# Patient Record
Sex: Female | Born: 1950 | Race: White | Hispanic: No | Marital: Single | Smoking: Current every day smoker
Health system: Southern US, Community
[De-identification: ages and names within clinical notes are randomized; demographics above are authoritative.]

## PROBLEM LIST (undated history)

## (undated) DIAGNOSIS — J449 Chronic obstructive pulmonary disease, unspecified: Secondary | ICD-10-CM

## (undated) HISTORY — DX: Chronic obstructive pulmonary disease, unspecified: J44.9

---

## 2006-01-31 ENCOUNTER — Emergency Department: Payer: Self-pay | Admitting: Emergency Medicine

## 2006-06-15 ENCOUNTER — Other Ambulatory Visit: Payer: Self-pay

## 2006-06-15 ENCOUNTER — Emergency Department: Payer: Self-pay | Admitting: Emergency Medicine

## 2007-03-17 ENCOUNTER — Other Ambulatory Visit: Payer: Self-pay

## 2007-03-17 ENCOUNTER — Emergency Department: Payer: Self-pay | Admitting: Emergency Medicine

## 2007-11-20 ENCOUNTER — Inpatient Hospital Stay: Payer: Self-pay | Admitting: Internal Medicine

## 2007-11-20 ENCOUNTER — Other Ambulatory Visit: Payer: Self-pay

## 2008-06-10 ENCOUNTER — Emergency Department: Payer: Self-pay | Admitting: Emergency Medicine

## 2008-09-11 IMAGING — CR DG CHEST 2V
1 series · 2 of 2 positions shown · non-contrast
Comparison: none

REASON FOR EXAM: SOB, COUGH; [HOSPITAL]
COMMENTS:

[Series 1: view not recorded · 0.17mm/px · 2 of 2 slices shown]
[im 1/2]
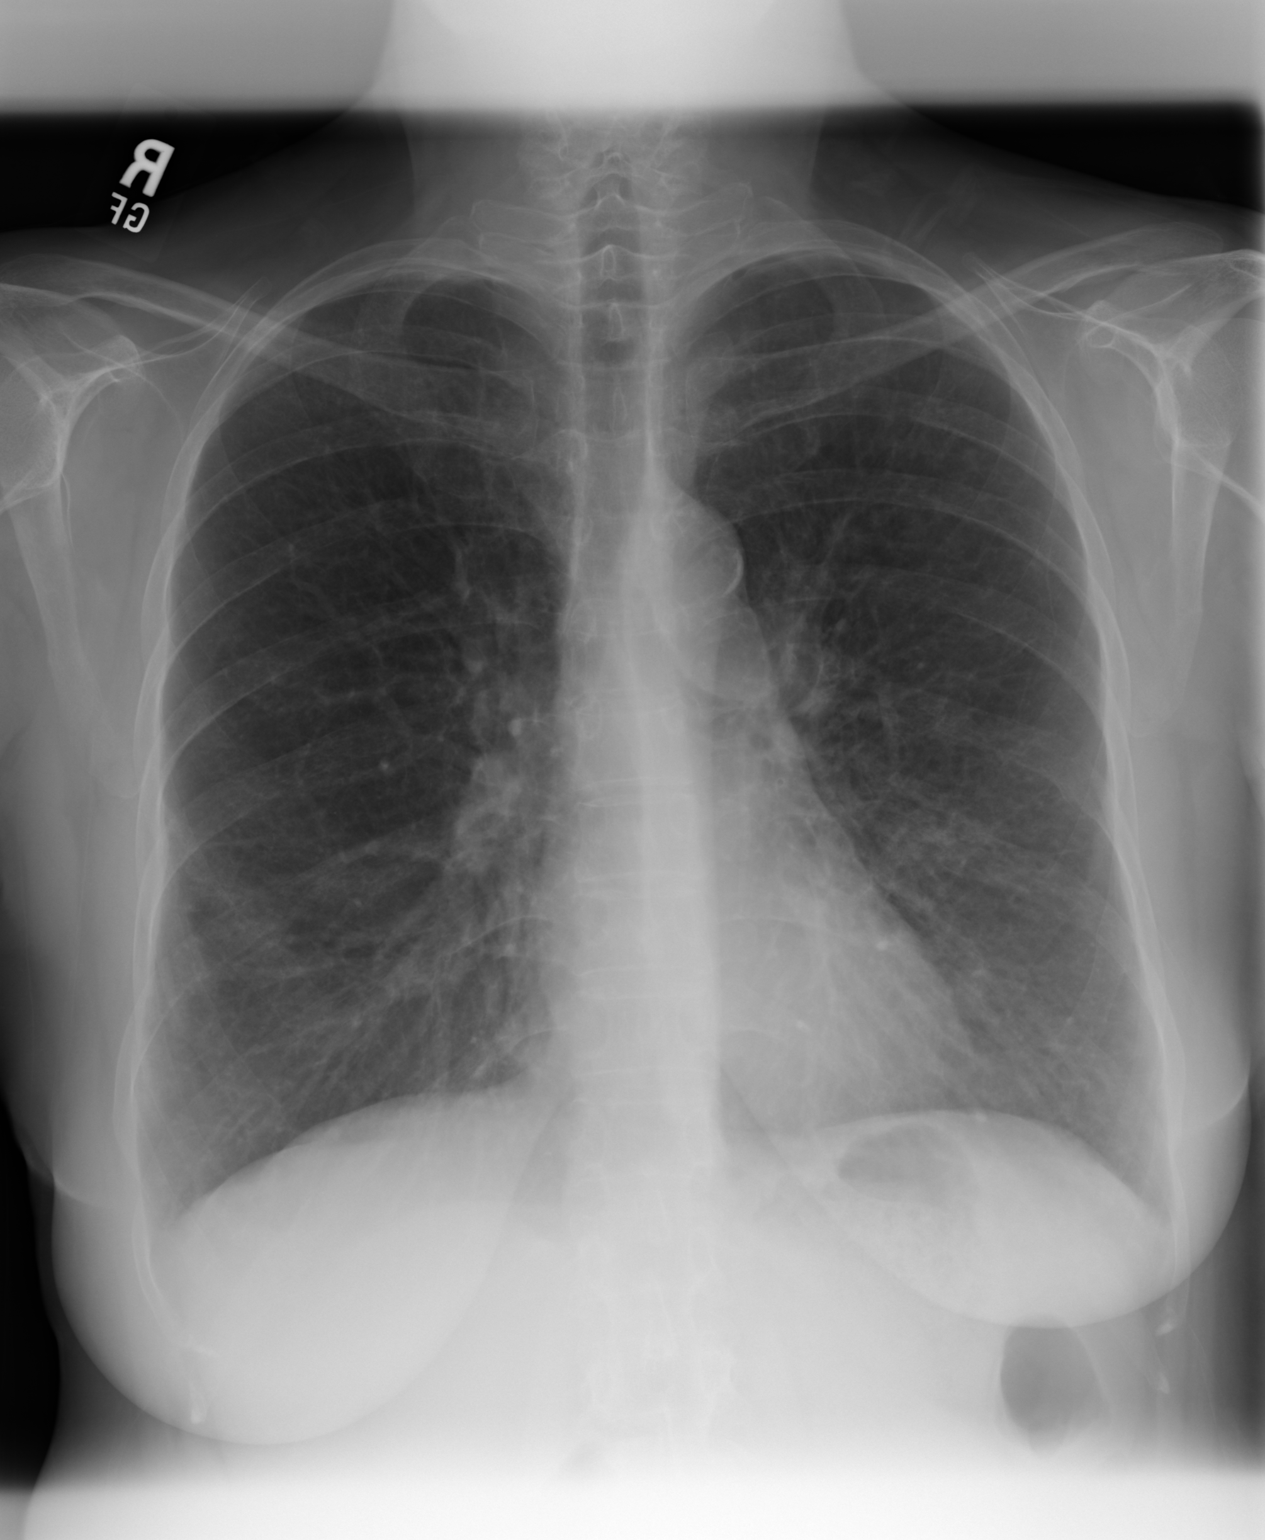
[im 2/2]
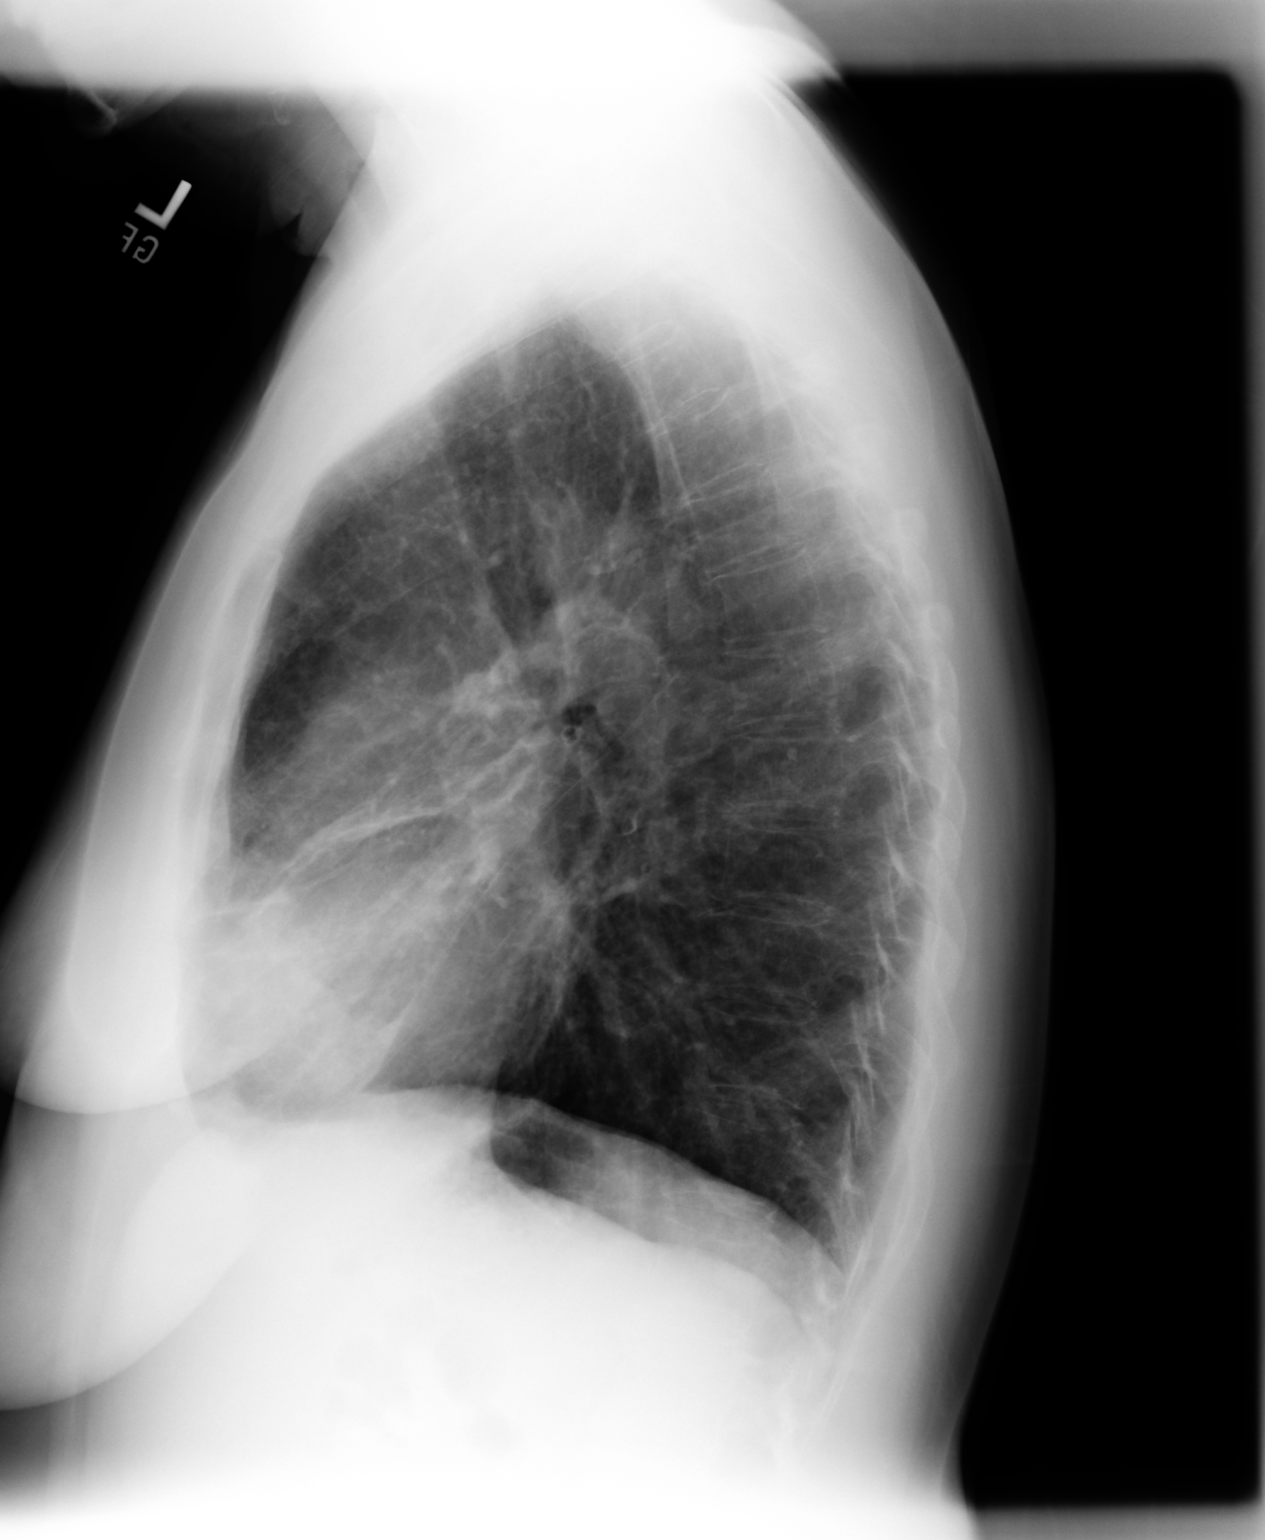

[2 of 2 positions shown; findings below may reference images not displayed]

PROCEDURE:     DXR - DXR CHEST PA (OR AP) AND LATERAL  - June 15, 2006  [DATE]

RESULT:     There is thickening of the RIGHT middle lobe lung markings
compatible with pneumonia or atelectasis. The lung fields otherwise are
clear. Heart size is normal. The chest is mildly hyperexpanded consistent
with a history of COPD or asthma. Incidental note is made of a fleck of
calcium in the aortic knob. No acute bony abnormalities are seen.
IMPRESSION: 1)There is increased density in the RIGHT middle lobe consistent with
pneumonia or atelectasis.

2)The chest appears mildly hyperexpanded.

## 2008-09-25 ENCOUNTER — Inpatient Hospital Stay: Payer: Self-pay | Admitting: Internal Medicine

## 2009-08-08 ENCOUNTER — Inpatient Hospital Stay: Payer: Self-pay | Admitting: Internal Medicine

## 2011-09-04 ENCOUNTER — Inpatient Hospital Stay: Payer: Self-pay | Admitting: Internal Medicine

## 2011-09-04 LAB — COMPREHENSIVE METABOLIC PANEL
Anion Gap: 6 — ABNORMAL LOW (ref 7–16)
BUN: 10 mg/dL (ref 7–18)
Bilirubin,Total: 0.3 mg/dL (ref 0.2–1.0)
Chloride: 105 mmol/L (ref 98–107)
Creatinine: 0.64 mg/dL (ref 0.60–1.30)
EGFR (African American): >60
Glucose: 129 mg/dL — ABNORMAL HIGH (ref 65–99)
Potassium: 3.9 mmol/L (ref 3.5–5.1)
SGOT(AST): 14 U/L — ABNORMAL LOW (ref 15–37)
SGPT (ALT): 21 U/L
Total Protein: 7.7 g/dL (ref 6.4–8.2)

## 2011-09-04 LAB — URINALYSIS, COMPLETE
Bacteria: NONE SEEN
Glucose,UR: 50 mg/dL (ref 0–75)
Ketone: NEGATIVE
Ph: 6 (ref 4.5–8.0)
Specific Gravity: 1.004 (ref 1.003–1.030)
Squamous Epithelial: <1
WBC UR: NONE SEEN /HPF (ref 0–5)

## 2011-09-04 LAB — CBC WITH DIFFERENTIAL/PLATELET
Basophil #: 0.1 10*3/uL (ref 0.0–0.1)
Eosinophil #: 0.6 10*3/uL (ref 0.0–0.7)
HGB: 15.7 g/dL (ref 12.0–16.0)
Lymphocyte %: 29.1 %
MCHC: 33.3 g/dL (ref 32.0–36.0)
Monocyte #: 0.8 10*3/uL — ABNORMAL HIGH (ref 0.0–0.7)
Neutrophil %: 56.1 %
RDW: 14 % (ref 11.5–14.5)
WBC: 10.1 10*3/uL (ref 3.6–11.0)

## 2011-09-04 LAB — HEMOGLOBIN A1C: Hemoglobin A1C: 6.5 % — ABNORMAL HIGH (ref 4.2–6.3)

## 2011-09-04 LAB — PROTIME-INR
INR: 0.9
Prothrombin Time: 12 secs (ref 11.5–14.7)

## 2011-09-05 LAB — CBC WITH DIFFERENTIAL/PLATELET
Basophil %: 0 %
Eosinophil %: 0.1 %
HGB: 15.2 g/dL (ref 12.0–16.0)
Lymphocyte #: 0.7 10*3/uL — ABNORMAL LOW (ref 1.0–3.6)
Lymphocyte %: 5.3 %
MCH: 31.3 pg (ref 26.0–34.0)
MCV: 95 fL (ref 80–100)
Monocyte #: 0.3 10*3/uL (ref 0.0–0.7)
Monocyte %: 2.1 %
Platelet: 323 10*3/uL (ref 150–440)

## 2011-09-05 LAB — BASIC METABOLIC PANEL
Anion Gap: 8 (ref 7–16)
BUN: 14 mg/dL (ref 7–18)
Calcium, Total: 9.3 mg/dL (ref 8.5–10.1)
Creatinine: 0.79 mg/dL (ref 0.60–1.30)
EGFR (African American): >60
EGFR (Non-African Amer.): >60
Sodium: 139 mmol/L (ref 136–145)

## 2012-01-21 ENCOUNTER — Inpatient Hospital Stay: Payer: Self-pay | Admitting: Internal Medicine

## 2012-01-21 LAB — CBC
HCT: 47.7 % — ABNORMAL HIGH (ref 35.0–47.0)
HGB: 15.6 g/dL (ref 12.0–16.0)
MCH: 30.8 pg (ref 26.0–34.0)
MCHC: 32.7 g/dL (ref 32.0–36.0)
RBC: 5.05 10*6/uL (ref 3.80–5.20)
RDW: 13.8 % (ref 11.5–14.5)

## 2012-01-21 LAB — BASIC METABOLIC PANEL
BUN: 5 mg/dL — ABNORMAL LOW (ref 7–18)
Chloride: 102 mmol/L (ref 98–107)
Co2: 24 mmol/L (ref 21–32)
Creatinine: 0.71 mg/dL (ref 0.60–1.30)
EGFR (African American): >60
Glucose: 140 mg/dL — ABNORMAL HIGH (ref 65–99)
Potassium: 3.8 mmol/L (ref 3.5–5.1)
Sodium: 139 mmol/L (ref 136–145)

## 2012-01-22 LAB — CBC WITH DIFFERENTIAL/PLATELET
Basophil %: 0.1 %
Eosinophil #: 0 10*3/uL (ref 0.0–0.7)
Eosinophil %: 0 %
HCT: 45.6 % (ref 35.0–47.0)
HGB: 14.9 g/dL (ref 12.0–16.0)
Lymphocyte #: 0.4 10*3/uL — ABNORMAL LOW (ref 1.0–3.6)
Lymphocyte %: 5.9 %
MCH: 30.8 pg (ref 26.0–34.0)
MCHC: 32.7 g/dL (ref 32.0–36.0)
MCV: 94 fL (ref 80–100)
Monocyte #: 0.1 x10 3/mm — ABNORMAL LOW (ref 0.2–0.9)
Monocyte %: 1.1 %
Neutrophil #: 6.8 10*3/uL — ABNORMAL HIGH (ref 1.4–6.5)
Platelet: 288 10*3/uL (ref 150–440)
RBC: 4.84 10*6/uL (ref 3.80–5.20)
WBC: 7.3 10*3/uL (ref 3.6–11.0)

## 2012-01-22 LAB — BASIC METABOLIC PANEL
Calcium, Total: 9 mg/dL (ref 8.5–10.1)
Chloride: 100 mmol/L (ref 98–107)
Creatinine: 0.63 mg/dL (ref 0.60–1.30)
EGFR (African American): >60
EGFR (Non-African Amer.): >60
Glucose: 223 mg/dL — ABNORMAL HIGH (ref 65–99)
Osmolality: 276 (ref 275–301)

## 2012-01-23 LAB — BASIC METABOLIC PANEL
Anion Gap: 4 — ABNORMAL LOW (ref 7–16)
BUN: 10 mg/dL (ref 7–18)
Calcium, Total: 8.9 mg/dL (ref 8.5–10.1)
Chloride: 102 mmol/L (ref 98–107)
Co2: 31 mmol/L (ref 21–32)
EGFR (Non-African Amer.): >60
Osmolality: 279 (ref 275–301)
Sodium: 137 mmol/L (ref 136–145)

## 2012-01-23 LAB — CBC WITH DIFFERENTIAL/PLATELET
Basophil %: 0.1 %
Eosinophil #: 0 10*3/uL (ref 0.0–0.7)
Eosinophil %: 0 %
Lymphocyte #: 0.7 10*3/uL — ABNORMAL LOW (ref 1.0–3.6)
Lymphocyte %: 4.2 %
MCH: 29.8 pg (ref 26.0–34.0)
MCHC: 31.9 g/dL — ABNORMAL LOW (ref 32.0–36.0)
MCV: 94 fL (ref 80–100)
Monocyte #: 0.5 x10 3/mm (ref 0.2–0.9)
Neutrophil %: 92.7 %
Platelet: 285 10*3/uL (ref 150–440)
RBC: 4.56 10*6/uL (ref 3.80–5.20)

## 2012-01-24 LAB — HEMOGLOBIN A1C: Hemoglobin A1C: 6 % (ref 4.2–6.3)

## 2012-09-04 ENCOUNTER — Emergency Department: Payer: Self-pay | Admitting: Emergency Medicine

## 2012-09-04 LAB — COMPREHENSIVE METABOLIC PANEL
Alkaline Phosphatase: 74 U/L (ref 50–136)
Bilirubin,Total: 0.3 mg/dL (ref 0.2–1.0)
Calcium, Total: 9.1 mg/dL (ref 8.5–10.1)
Co2: 27 mmol/L (ref 21–32)
Creatinine: 0.47 mg/dL — ABNORMAL LOW (ref 0.60–1.30)
EGFR (African American): >60
EGFR (Non-African Amer.): >60
Osmolality: 278 (ref 275–301)
SGPT (ALT): 19 U/L (ref 12–78)
Sodium: 140 mmol/L (ref 136–145)
Total Protein: 7.7 g/dL (ref 6.4–8.2)

## 2012-09-04 LAB — CBC
HGB: 16.3 g/dL — ABNORMAL HIGH (ref 12.0–16.0)
MCH: 30.8 pg (ref 26.0–34.0)
RDW: 14.2 % (ref 11.5–14.5)

## 2012-09-04 LAB — CK TOTAL AND CKMB (NOT AT ARMC)
CK, Total: 109 U/L (ref 21–215)
CK-MB: 2.2 ng/mL (ref 0.5–3.6)

## 2012-09-04 LAB — PRO B NATRIURETIC PEPTIDE: B-Type Natriuretic Peptide: 123 pg/mL (ref 0–125)

## 2012-09-17 ENCOUNTER — Inpatient Hospital Stay: Payer: Self-pay | Admitting: Internal Medicine

## 2012-09-17 LAB — COMPREHENSIVE METABOLIC PANEL
Alkaline Phosphatase: 89 U/L (ref 50–136)
BUN: 11 mg/dL (ref 7–18)
Calcium, Total: 9 mg/dL (ref 8.5–10.1)
Co2: 28 mmol/L (ref 21–32)
EGFR (African American): >60
EGFR (Non-African Amer.): >60
Glucose: 133 mg/dL — ABNORMAL HIGH (ref 65–99)
Osmolality: 273 (ref 275–301)
Potassium: 4.4 mmol/L (ref 3.5–5.1)
SGOT(AST): 35 U/L (ref 15–37)
SGPT (ALT): 22 U/L (ref 12–78)
Sodium: 136 mmol/L (ref 136–145)

## 2012-09-17 LAB — URINALYSIS, COMPLETE
Glucose,UR: NEGATIVE mg/dL (ref 0–75)
Ketone: NEGATIVE
Ph: 6 (ref 4.5–8.0)
Protein: 30
RBC,UR: 1 /HPF (ref 0–5)
Specific Gravity: 1.006 (ref 1.003–1.030)
WBC UR: NONE SEEN /HPF (ref 0–5)

## 2012-09-17 LAB — CBC WITH DIFFERENTIAL/PLATELET
Basophil #: 0.1 10*3/uL (ref 0.0–0.1)
Eosinophil %: 2.4 %
HCT: 42.2 % (ref 35.0–47.0)
Lymphocyte %: 8.6 %
MCH: 30.8 pg (ref 26.0–34.0)
MCHC: 32.9 g/dL (ref 32.0–36.0)
MCV: 94 fL (ref 80–100)
Monocyte %: 7.8 %
Neutrophil #: 8.1 10*3/uL — ABNORMAL HIGH (ref 1.4–6.5)
Neutrophil %: 80.7 %
Platelet: 238 10*3/uL (ref 150–440)
RBC: 4.51 10*6/uL (ref 3.80–5.20)
RDW: 14.1 % (ref 11.5–14.5)
WBC: 10 10*3/uL (ref 3.6–11.0)

## 2012-09-17 LAB — PROTIME-INR: Prothrombin Time: 11.8 secs (ref 11.5–14.7)

## 2012-09-17 LAB — CK TOTAL AND CKMB (NOT AT ARMC): CK-MB: 2.3 ng/mL (ref 0.5–3.6)

## 2012-09-18 LAB — MAGNESIUM: Magnesium: 1.8 mg/dL

## 2013-05-01 HISTORY — PX: CARDIAC CATHETERIZATION: SHX172

## 2013-05-19 LAB — BASIC METABOLIC PANEL
Anion Gap: 4 — ABNORMAL LOW (ref 7–16)
BUN: 7 mg/dL (ref 7–18)
Calcium, Total: 8.9 mg/dL (ref 8.5–10.1)
Co2: 29 mmol/L (ref 21–32)
Creatinine: 0.61 mg/dL (ref 0.60–1.30)
EGFR (African American): >60
Glucose: 111 mg/dL — ABNORMAL HIGH (ref 65–99)
Osmolality: 272 (ref 275–301)
Potassium: 3.8 mmol/L (ref 3.5–5.1)
Sodium: 137 mmol/L (ref 136–145)

## 2013-05-19 LAB — TROPONIN I: Troponin-I: 0.38 ng/mL — ABNORMAL HIGH

## 2013-05-19 LAB — CBC
HCT: 47.1 % — ABNORMAL HIGH (ref 35.0–47.0)
MCH: 30.8 pg (ref 26.0–34.0)
MCHC: 33.7 g/dL (ref 32.0–36.0)
Platelet: 321 10*3/uL (ref 150–440)
RDW: 14 % (ref 11.5–14.5)
WBC: 10 10*3/uL (ref 3.6–11.0)

## 2013-05-20 ENCOUNTER — Inpatient Hospital Stay: Payer: Self-pay | Admitting: Internal Medicine

## 2013-05-20 DIAGNOSIS — I214 Non-ST elevation (NSTEMI) myocardial infarction: Secondary | ICD-10-CM

## 2013-05-20 DIAGNOSIS — F17200 Nicotine dependence, unspecified, uncomplicated: Secondary | ICD-10-CM

## 2013-05-20 DIAGNOSIS — I519 Heart disease, unspecified: Secondary | ICD-10-CM

## 2013-05-20 DIAGNOSIS — J441 Chronic obstructive pulmonary disease with (acute) exacerbation: Secondary | ICD-10-CM

## 2013-05-20 LAB — LIPID PANEL
Cholesterol: 176 mg/dL (ref 0–200)
Triglycerides: 54 mg/dL (ref 0–200)

## 2013-05-20 LAB — TROPONIN I: Troponin-I: 0.49 ng/mL — ABNORMAL HIGH

## 2013-05-21 ENCOUNTER — Encounter: Payer: Self-pay | Admitting: Cardiovascular Disease

## 2013-05-21 DIAGNOSIS — I2510 Atherosclerotic heart disease of native coronary artery without angina pectoris: Secondary | ICD-10-CM

## 2013-05-23 ENCOUNTER — Emergency Department: Payer: Self-pay | Admitting: Emergency Medicine

## 2013-05-23 LAB — CBC
HCT: 50.7 % — ABNORMAL HIGH (ref 35.0–47.0)
HGB: 16.9 g/dL — ABNORMAL HIGH (ref 12.0–16.0)
MCV: 93 fL (ref 80–100)
RBC: 5.48 10*6/uL — ABNORMAL HIGH (ref 3.80–5.20)
WBC: 11.8 10*3/uL — ABNORMAL HIGH (ref 3.6–11.0)

## 2013-05-23 LAB — COMPREHENSIVE METABOLIC PANEL
Anion Gap: 4 — ABNORMAL LOW (ref 7–16)
BUN: 15 mg/dL (ref 7–18)
Calcium, Total: 8.6 mg/dL (ref 8.5–10.1)
Chloride: 106 mmol/L (ref 98–107)
Co2: 28 mmol/L (ref 21–32)
Creatinine: 0.66 mg/dL (ref 0.60–1.30)
EGFR (African American): >60
EGFR (Non-African Amer.): >60
Glucose: 116 mg/dL — ABNORMAL HIGH (ref 65–99)
Potassium: 3.9 mmol/L (ref 3.5–5.1)
SGOT(AST): 24 U/L (ref 15–37)
SGPT (ALT): 18 U/L (ref 12–78)
Sodium: 138 mmol/L (ref 136–145)

## 2013-05-24 ENCOUNTER — Telehealth: Payer: Self-pay

## 2013-05-24 ENCOUNTER — Encounter: Payer: Self-pay | Admitting: *Deleted

## 2013-05-24 NOTE — Telephone Encounter (Signed)
Attempted to contact pt regarding discharge from [facility] 05/21/13.  Pt has followup w/ Dr. Kirke Corin 05/31/13 @ 11:30 at [facility].  Voicemail box is full.

## 2013-05-31 ENCOUNTER — Encounter: Payer: Medicare Other | Admitting: Cardiovascular Disease

## 2013-05-31 ENCOUNTER — Encounter: Payer: Self-pay | Admitting: *Deleted

## 2013-06-10 ENCOUNTER — Emergency Department: Payer: Self-pay | Admitting: Emergency Medicine

## 2013-06-10 LAB — BASIC METABOLIC PANEL
Anion Gap: 4 — ABNORMAL LOW (ref 7–16)
BUN: 10 mg/dL (ref 7–18)
Calcium, Total: 9.3 mg/dL (ref 8.5–10.1)
Chloride: 104 mmol/L (ref 98–107)
Creatinine: 0.75 mg/dL (ref 0.60–1.30)
EGFR (African American): >60
EGFR (Non-African Amer.): >60
Glucose: 144 mg/dL — ABNORMAL HIGH (ref 65–99)
Osmolality: 279 (ref 275–301)
Potassium: 3.8 mmol/L (ref 3.5–5.1)
Sodium: 139 mmol/L (ref 136–145)

## 2013-06-10 LAB — CBC
HCT: 46.5 % (ref 35.0–47.0)
HGB: 15.2 g/dL (ref 12.0–16.0)
MCH: 29.7 pg (ref 26.0–34.0)
MCV: 91 fL (ref 80–100)
RBC: 5.11 10*6/uL (ref 3.80–5.20)
RDW: 13.7 % (ref 11.5–14.5)
WBC: 11.6 10*3/uL — ABNORMAL HIGH (ref 3.6–11.0)

## 2014-10-18 NOTE — H&P (Signed)
PATIENT NAME:  Ellen Abbott, Ellen Abbott MR#:  [id number] DATE OF BIRTH:  01-06-1951  DATE OF ADMISSION:  01/21/2012  PRIMARY CARE PHYSICIAN: [facility]    CHIEF COMPLAINT: Shortness of breath.   HISTORY OF PRESENT ILLNESS: The patient is a 64 year old female with a history of smoking dependence and COPD who presents with the above complaint. Last week the patient suffered a cold. She also had earache and runny nose. All these symptoms actually got better, however, over today she had shortness of breath, wheezing, and nonproductive cough. Her shortness of breath and wheezing did not resolve after doing her DuoNebs twice, using her ProAir and Advair, so she came here for further evaluation. In the ER she received DuoNebs. She still continues to have prolonged expiration and wheezing.   REVIEW OF SYSTEMS: CONSTITUTIONAL: No fever, fatigue, weakness. EYES: No blurred or double vision, glaucoma or cataracts. ENT: No ear pain, hearing loss, seasonal allergies. She had some ear pain while she had a cold last week but this resolved. RESPIRATORY: Positive cough. Positive wheezing. Positive COPD, not on oxygen. No painful respirations or hemoptysis. Positive dyspnea. CARDIOVASCULAR: No chest pain, orthopnea, edema, or arrhythmia. Positive dyspnea or exertion. No palpitations or syncope. GI: No nausea, vomiting, diarrhea, abdominal pain, melena, or ulcers. GU: No dysuria or hematuria. ENDOCRINE: No polyuria or polydipsia. HEME/LYMPH: No easy bruising, bleeding, swollen glands. SKIN: No rash or lesions. MUSCULOSKELETAL: No limited activity. No pain in shoulders or knees. NEUROLOGIC: No history of CVA, TIA, weakness, dementia. PSYCH: No history of anxiety or depression.   PAST MEDICAL HISTORY: 1. Chronic obstructive pulmonary disease. 2. Tobacco dependence.   MEDICATIONS:  1. Advair Diskus 500/50 b.i.d.  2. Spiriva 18 mcg daily.  3. ProAir HFA 90 mcg daily.   SOCIAL HISTORY: The patient smokes 1 pack a day.  Occasional alcohol use.   PAST SURGICAL HISTORY: Hysterectomy.   FAMILY HISTORY: Positive for COPD and CAD.   ALLERGIES: No known drug allergies.   PHYSICAL EXAMINATION:   VITAL SIGNS: Temperature 98.6, pulse 100, respirations 30, blood pressure 144/79, 98% on 2 liters.   GENERAL: The patient is alert and oriented in moderate distress. No tripoding.  HEENT: Head is atraumatic. Pupils are round. Sclerae anicteric. Mucous membranes are dry. Oropharynx is clear.   NECK: Supple without JVD, carotid bruit, or enlarged thyroid.   CARDIOVASCULAR: Tachycardia without murmur, gallops, or rubs. PMI is not displaced.   LUNGS: The patient has prolonged expiration with fair air movement. Bilateral wheezing. No crackles or rales are heard.   BACK: No CVA or vertebral tenderness.   ABDOMEN: Bowel sounds are positive. Nontender, nondistended. No hepatosplenomegaly.   EXTREMITIES: No clubbing, cyanosis, or edema.   NEUROLOGIC: Cranial nerves II through XII are intact. No focal deficits.   SKIN: Without rash or lesions.   MUSCULOSKELETAL: 5 out of 5 strength in all extremities.   LABORATORY, DIAGNOSTIC, AND RADIOLOGICAL DATA: White blood cells 11, hemoglobin 15, hematocrit 47.7, platelets 323, sodium 139, potassium 3.8, chloride 102, bicarb 24, BUN 5, creatinine 0.71, glucose 140, calcium 9.2.   Chest x-ray shows no infiltrate.   EKG normal sinus rhythm. No ST elevation or depression.   ASSESSMENT AND PLAN: This is a 64 year old female with a history of smoking dependence who presents with acute on chronic respiratory failure from COPD exacerbation with acute bronchitis.   1. Acute on chronic respiratory failure secondary to acute COPD exacerbation secondary to acute bronchitis as outlined below.   2. Acute COPD exacerbation secondary to  acute bronchitis. The patient will be started on Zithromax, steroids, DuoNebs, oxygen. She will continue her inhalers and will clinically follow.   3. Smoking dependence. Encouraged the patient to stop smoking. The patient was counseled for three minutes. She does want to quit smoking. We placed a nicotine patch.  4. Hyperglycemia. Will check a BMP in the a.m. Further work-up depending on this fasting glucose.   CODE STATUS: The patient is a DO NOT RESUSCITATE status.   TIME SPENT: Approximately 35 minutes.   ____________________________ Janyth Contes. Juliene Pina, MD spm:drc D: 01/21/2012 21:03:18 ET T: 01/22/2012 07:04:43 ET JOB#: [id number]  cc: Xochilth Standish P. Juliene Pina, MD, <Dictator> [facility] Davied Nocito P Eesha Schmaltz MD ELECTRONICALLY SIGNED 01/22/2012 20:14

## 2014-10-18 NOTE — Discharge Summary (Signed)
PATIENT NAME:  Ellen Abbott, Ellen Abbott MR#:  [id number] DATE OF BIRTH:  03/16/1951  DATE OF ADMISSION:  01/21/2012 DATE OF DISCHARGE:  01/25/2012  PRESENTING COMPLAINT: Shortness of breath.   DISCHARGE DIAGNOSES:  1. Acute on chronic hypoxic respiratory failure due to chronic obstructive pulmonary disease flare.  2. End-stage chronic obstructive pulmonary disease.  3. Ongoing tobacco abuse.  4. Acute bronchitis, sats 85% to 88% on room air on exertion, improved to 91% to 93% on 2 liters nasal cannula continuous.   MEDICATIONS:  1. Advair 500/50, 1 puff b.i.d.  2. ProAir HFA 2 puffs as needed.  3. DuoNebs 3 mL 2 or 3 times a day as needed.  4. Fosamax 70 mg q. week on Sunday.  5. Levaquin 750 p.o. daily for three more days.  6. Prednisone taper.  7. Nicotine gum.   DISCHARGE INSTRUCTIONS:   1. [facility].  2. Stop smoking.  3. Home oxygen as instructed. Patient was initiated on home oxygen during this admission.   LABORATORY, DIAGNOSTIC AND RADIOLOGICAL DATA: Hemoglobin A1c 6. CT of the chest shows ill-defined area of increased density in the anteromedial aspect of the medial segment of right middle lobe; differential atelectasis versus infiltrate. Patient will need further evaluation on PET and CT and oncology consultation. No CT evidence of pulmonary arterial embolic disease. There is a 1.52 x 1.22 cm subcutaneous nodule appreciated along the superior anterior aspect of the left breast.   CODE STATUS: NO CODE, DO NOT RESUSCITATE.   BRIEF SUMMARY OF HOSPITAL COURSE: [name] is a 64 year old Caucasian female with history of chronic obstructive pulmonary disease comes in with:  1. Acute on chronic respiratory failure from chronic obstructive pulmonary disease flare and ongoing tobacco abuse. Patient was admitted on medical floor, started on IV Solu-Medrol, nebulizers, inhalers, empiric IV antibiotics. Patient's CT chest as  stated above were noted. She will need follow up either CT or chest x-ray to evaluate the density that is noted in the anteromedial aspect of the medial segment of right middle lobe. Will defer it to primary care physician to evaluate once patient is done with antibiotics ensuring it clears else patient will need further workup to rule out more ominous causes such as lung malignancy given patient's history of tobacco abuse.  2. Acute bronchitis. She is weaned off steroids and antibiotics.  3. Tobacco abuse. Patient wants to quit but will continue nicotine gum at discharge and work on smoking cessation.  4. Hyperglycemia was likely steroid-induced. A1c was 6.0.  5. Patient continued to remain hypoxic on exertion hence home oxygen was arranged with the help of care management.  6. Hospital stay otherwise remained stable. Patient remained a NO CODE, DO NOT RESUSCITATE.   TIME SPENT: 40 minutes.  ____________________________ Wylie HailSona A. Allena KatzPatel, MD sap:cms D: 01/27/2012 13:04:16 ET T: 01/28/2012 11:54:58 ET JOB#: [id number]  cc: Richanda Darin A. Allena KatzPatel, MD, <Dictator> [facility] Tavarion Babington Ammie DaltonA Adisa Litt MD ELECTRONICALLY SIGNED 02/04/2012 7:17

## 2014-10-21 NOTE — Discharge Summary (Signed)
PATIENT NAME:  Ellen Abbott, Ellen Abbott MR#:  [id number] DATE OF BIRTH:  1950/07/30  DATE OF ADMISSION:  09/17/2012 DATE OF DISCHARGE:  09/20/2012  PRIMARY CARE PHYSICIAN: Nonlocal.  DISCHARGE DIAGNOSES: 1. Acute on chronic respiratory failure.  2. Chronic obstructive pulmonary disease exacerbation.  3. Tobacco abuse.   CONDITION: Stable.   CODE STATUS:  FULL CODE.     HOME MEDICATIONS: 1. DuoNebs 0.5 mg/2.5 mg/3 mL inhalation, 1nebulizer 3 times a day.  2. Prednisone 40 mg p.o. daily, then taper.  3. Tessalon 100 mg p.o. every 6 hours p.r.n. for cough.  4. ProAir FHA 90 micrograms inhalation 2 puffs every 4 to 6 hours p.r.n.  5. Advair Diskus 250 mcg/50 mcg inhalation powder 1 puff  b.i.d.   HOME OXYGEN:  The patient also has home oxygen 2 liters by nasal cannula.   DIET: Regular diet.   ACTIVITY: As tolerated.   FOLLOW-UP CARE: With PCP within 1 to 2 weeks. The patient needs smoking cessation.   REASON FOR ADMISSION: Shortness of breath, wheezing, cough for 1 day.   HOSPITAL COURSE: The patient is a 64 year old Caucasian female with a history of COPD, chronic respiratory failure on home oxygen, tobacco abuse, presented to the ED with shortness of breath, cough, wheezing for 1 day.  The patient's O2 saturation was low at 80s in the ED.  She had a tachycardia of 130s. She was admitted for COPD exacerbation. For a detailed history and physical examination, please refer to admission note dictated by me. On the admission date, the patient's CBC was normal, BUN 11, creatinine 0.47. Electrolytes were normal. Troponin was less than 0.02. After admission, the patient has been treated with Solu-Medrol IVPB with Topamax, Spiriva, Zithromax and Rocephin. In addition, the patient was consulted for smoking cessation. After the above-mentioned treatment, the patient's symptoms have much improved. She only has a mild cough.  Physical examination showed mild wheezing but no crackles or rales. The patient  denies any shortness of breath, but she is still on home oxygen 2 liters. She is clinically stable and will be discharged to home today.   I discussed the patient's discharge plan with the patient and case manager and nurse.   TIME SPENT: About 33 minutes.    ____________________________ Shaune PollackQing Suzanna Zahn, MD qc:cb D: 09/20/2012 13:43:09 ET T: 09/20/2012 15:09:29 ET JOB#: [id number]  cc: Shaune PollackQing Topaz Raglin, MD, <Dictator> Shaune PollackQING Mar Zettler MD ELECTRONICALLY SIGNED 09/23/2012 18:14

## 2014-10-21 NOTE — Consult Note (Signed)
PATIENT NAME:  Ellen Abbott, Gillian MR#:  [id number] DATE OF BIRTH:  1950-08-05  DATE OF CONSULTATION:  05/20/2013  REFERRING PHYSICIAN: Dr. Cherlynn KaiserSainani  CONSULTING PHYSICIAN:  Jerolyn CenterMuhammad A. Kirke CorinArida, MD  REASON FOR CONSULTATION: Elevated cardiac enzymes.   HISTORY OF PRESENT ILLNESS: This is a 64 year old female with known history of COPD on home oxygen as well as prolonged history of tobacco use. She presented to the Emergency Room with worsening dyspnea, which was not responsive to nebulizer treatment at home. She was noted to have COPD exacerbation on presentation and was started on IV steroids and breathing treatments. She was noted to have an initial troponin of 0.38, which subsequently increased to 0.5. She denies any chest discomfort. However, she has significant dyspnea at rest and with the physical activities. No orthopnea or PND. She has no previous cardiac history. There is no history of diabetes or hypertension.   PAST MEDICAL HISTORY:  1.  COPD, on home oxygen.  2.  Tobacco use.   HOME MEDICATIONS: Include Advair twice daily, Duo-Neb 3 times daily and albuterol as needed.   ALLERGIES: No known drug allergies.   SOCIAL HISTORY: She smokes 1/2 pack per day and has been smoking for at least 40 years. She drinks alcohol occasionally and denies any recreational drug use. She lives by herself, but [place] the [place] to be close to her family.   FAMILY HISTORY: Mother had myocardial infarction in her early 2070s. Both parents had COPD.   REVIEW OF SYSTEMS: A 10-point review of systems was performed. It is negative other than what is mentioned in the HPI.   PHYSICAL EXAMINATION:  GENERAL: The patient appears to be at her stated age and in no acute distress.  VITAL SIGNS: Temperature 98.0, pulse 97, respiratory rate 20, blood pressure is 140/73 and oxygen saturation is 91% on 3 L nasal cannula.  HEENT: Normocephalic, atraumatic.  NECK: No JVD or carotid  bruits.  RESPIRATORY: Normal respiratory effort with no use of accessory muscles. Auscultation reveals slightly diminished breath sounds bilaterally.  CARDIOVASCULAR: Normal PMI. Normal S1 and S2 with no gallops or murmurs.  ABDOMEN: Benign, nontender and nondistended.  EXTREMITIES: No clubbing, cyanosis, or edema.  SKIN: Warm and dry with no rash.  PSYCHIATRIC: She is alert, oriented x 3 with normal mood and affect.   LABORATORY AND DIAGNOSTIC DATA: Renal function is normal. Troponin was 0.38 and increased to 0.5. CBC is unremarkable. EKG showed sinus rhythm with poor R wave progression in the anterior leads.   IMPRESSION:  1.  Non-ST elevation myocardial infarction.  2.  Chronic obstructive pulmonary disease exacerbation.  3.  Tobacco use.   RECOMMENDATIONS: The patient's elevated cardiac enzymes are consistent with non-ST elevation myocardial infarction. It is difficult to determine with certainty whether this is a type II supply/demand ischemia or whether it is a primary cardiac event. Obviously, she has multiple risk factors for coronary artery disease. I discussed with her different management options including proceeding with a stress test for risk stratification versus cardiac catheterization for a definitive diagnosis. A stress test, I think, will have some limitations due to severe COPD. The risks, benefits and alternatives to cardiac catheterization were also discussed in details. After discussion, we decided to proceed with cardiac catheterization, which will be scheduled for tomorrow. In the meantime, I will give the patient 2 doses of low molecular weight heparin. Continue treatment with aspirin and small dose metoprolol. If cardiac catheterization shows underlying  coronary artery disease, she will need to be started on a statin.  ____________________________ Chelsea Aus. Kirke Corin, MD maa:aw D: 05/20/2013 08:40:30 ET T: 05/20/2013 08:56:11 ET JOB#: [id number]  cc: Jerolyn Center A. Kirke Corin, MD,  <Dictator> Iran Ouch MD ELECTRONICALLY SIGNED 05/23/2013 11:09

## 2014-10-21 NOTE — H&P (Signed)
PATIENT NAME:  Ellen Abbott, Ellen Abbott MR#:  [id number] DATE OF BIRTH:  Jan 30, 1951  DATE OF ADMISSION:  05/19/2013  PRIMARY CARE PHYSICIAN: At [facility].   CHIEF COMPLAINT: Shortness of breath.   HISTORY OF PRESENT ILLNESS: This is a 64 year old female who presents to the hospital with shortness of breath, progressively getting worse over the past 2 to 3 days. The patient has history of underlying COPD, is oxygen dependent. She has some baseline shortness of breath, but over the past 2 days, it has been significantly worse. She attempted to treat herself at home with giving herself multiple continuous DuoNebs along with using albuterol inhaler but she was not feeling any better and therefore came to the ER for further evaluation.   The patient was noted to be in COPD exacerbation, received some IV Solu-Medrol along with some DuoNebs and is significantly feeling much better.   On further work-up, she was noted to have an elevated troponin of 0.38. She has no chest pain. Hospitalist services were contacted for further treatment and evaluation.   The patient denies any chest pain as mentioned, no nausea, no vomiting, no diaphoresis, no palpitations, and no other associated symptoms presently.   REVIEW OF SYSTEMS: CONSTITUTIONAL: No documented fever. No weight gain or weight loss.  EYES: No blurred or double vision.  ENT: No tinnitus. No postnasal drip. No redness of the oropharynx.  RESPIRATORY: No cough. Positive wheeze. No hemoptysis. Positive dyspnea. Positive COPD.  CARDIOVASCULAR: No chest pain, no orthopnea, no palpitations, no syncope.  GASTROINTESTINAL: No nausea, no vomiting, no diarrhea. No abdominal pain. No melena or hematochezia.  GENITOURINARY: No dysuria or hematuria.  ENDOCRINE: No polyuria or nocturia. No heat cold intolerance.  HEMATOLOGIC: No anemia, no bruising, no bleeding.  INTEGUMENTARY: No rashes. No lesions.  MUSCULOSKELETAL: No arthritis, no swelling, no gout.   NEUROLOGIC: No numbness. No tingling. No ataxia. No seizure-type activity.  PSYCHIATRIC: No anxiety, no insomnia. No ADD.   PAST MEDICAL HISTORY: Consistent with COPD with ongoing tobacco abuse.   ALLERGIES: No known drug allergies.   SOCIAL HISTORY: Does smoke about 1/2 pack per day, has been smoking for the past 40 years. Occasional alcohol abuse. No illicit drug abuse. Lives at home by herself.   FAMILY HISTORY: Both mother and father are deceased. Mother died from complications of MI and COPD. Father also had emphysema and COPD.   CURRENT MEDICATIONS: Advair 250/50, one puff b.i.d., DuoNebs t.i.d. as needed, albuterol inhaler 2 puffs q.4-6 hours as needed.   PHYSICAL EXAMINATION: Presently is as follows:  VITAL SIGNS: Temperature is 97.8. Pulse 96, respirations 26, blood pressure 140/88, sats 98% on 2 liters nasal cannula.  GENERAL: A pleasant-appearing female in no apparent distress.  HEENT: Atraumatic, normocephalic. Her extraocular muscles are intact. Pupils equal and reactive to light. Sclerae anicteric. No conjunctival injection. No pharyngeal erythema.  NECK: Supple. There is no jugular venous distention. No bruits. No lymphadenopathy or thyromegaly.  HEART: Tachycardic, regular. No murmurs. No rubs, no clicks.  LUNGS: Prolonged inspiratory and expiratory phases, minimal end-expiratory wheezing, positive use of accessory muscles. No dullness to percussion.  ABDOMEN: Soft, flat, nontender, nondistended. Has good bowel sounds. No hepatosplenomegaly appreciated.  EXTREMITIES: No evidence of any cyanosis, clubbing, or peripheral edema. Has +2 pedal and radial pulses bilaterally.  NEUROLOGIC: Alert, awake, and oriented x3 with no focal motor or sensory deficits appreciated bilaterally.  SKIN: Moist and warm with no rashes appreciated.  LYMPHATIC: There is no cervical or axillary lymphadenopathy.  LABORATORY DATA: Serum glucose of 111, BUN 7, creatinine 0.6, sodium 137, potassium  3.8, chloride 104, bicarb 29. Troponin is 0.38. White cell count 10, hemoglobin 15.8, hematocrit 47.1, platelet count 321.   EKG showed normal sinus rhythm with normal axes and no evidence of any acute ST-T-wave changes.   The patient also had a chest x-ray done which showed no evidence of acute cardiopulmonary disease.   ASSESSMENT AND PLAN: This is a 64 year old female with history of chronic obstructive pulmonary disease, oxygen dependent; ongoing tobacco abuse; who presents to the hospital due to shortness of breath and noted to be in chronic obstructive pulmonary disease exacerbation and also incidentally noted to have an elevated troponin.   1.  Chronic obstructive pulmonary disease exacerbation. This is likely secondary to her ongoing tobacco abuse. The patient failed outpatient therapy with continuous nebulizers and her albuterol inhaler. I will admit her and start her on IV steroids, continue DuoNebs around the clock, continue her Advair. Add Spiriva. She is already on oxygen at home. Her chest x-ray is negative for pneumonia; therefore, I will not give her any IV antibiotics at this point.  2.  Elevated troponin. This was incidentally noted. The patient initially had no chest pain. She has no acute EKG changes. She has no previous cardiac history. For now, I will observe her on off unit telemetry. Continue aspirin. Check a lipid profile in the morning. Get a cardiology consult. I discussed the case with Dr. Mariah MillingGollan who will see the patient.   CODE STATUS: FULL CODE.   TIME SPENT WITH ADMISSION: 45 minutes.  ____________________________ Rolly PancakeVivek J. Cherlynn KaiserSainani, MD vjs:np D: 05/19/2013 22:11:16 ET T: 05/19/2013 22:39:22 ET JOB#: [id number]  cc: Rolly PancakeVivek J. Cherlynn KaiserSainani, MD, <Dictator> [place] SirenVIVEK J Hagan Vanauken MD ELECTRONICALLY SIGNED 06/05/2013 18:32

## 2014-10-21 NOTE — H&P (Signed)
PATIENT NAME:  Ellen Abbott, Ellen Abbott MR#:  [id number] DATE OF BIRTH:  [birth year]  DATE OF ADMISSION:  09/17/2012  PRIMARY CARE PHYSICIAN:  None local.   REFERRING PHYSICIAN:  Dr. Manson PasseyBrown.   CHIEF COMPLAINT:  Shortness of breath, cough, wheezing for 1 day.   HISTORY OF PRESENT ILLNESS:  A 64 year old Caucasian female with a history of chronic obstructive pulmonary disease, tobacco dependence, who presented to the ED with shortness of breath, cough and wheezing for 1 day. The patient is alert, awake, oriented, in no acute distress. The patient said that she has had shortness of breath, cough and wheezing for the past 1 day. She used her nebulizer without relief. The patient came to the ED for further evaluation. Her O2 sat was in the 80s, and also she has tachycardia at 130. The patient was treated with nebulizer, Solu-Medrol in ED, but still has wheezing and shortness of breath. She denies any fever or chills. No headache or dizziness. No chest pain, palpitation. No orthopnea or nocturnal dyspnea.   PAST MEDICAL HISTORY: As mentioned above, COPD and tobacco abuse.   SOCIAL HISTORY:  Smokes 1 pack a day for many years, but decreased to about 5 cigarettes a day. Denies any alcohol drinking or illicit drugs.   PAST SURGICAL HISTORY:  Hysterectomy.   FAMILY HISTORY:  COPD in her father, her brother, and also has a family history of CAD.   ALLERGIES:  None.  MEDICATIONS:  ProAir HFA 90 mcg inhalation 2 puffs every 4 to 6 hours p.r.n.,  DuoNeb 0.5 mg/2.5 mg/3 mL inhalation solution 3 times a day.   REVIEW OF SYSTEMS:  CONSTITUTIONAL:  The patient denies any fever or chills. No headache. No dizziness, but has generalized weakness.  EYES:  No double vision or blurry vision.  EAR, NOSE, THROAT:  No postnasal drip, slurred speech or dysphagia.  CARDIOVASCULAR:  No chest pain, palpitation, orthopnea or nocturnal dyspnea. No leg edema.  PULMONARY:  Positive for cough, sputum, shortness of breath and wheezing.  No hemoptysis.  GASTROINTESTINAL:  No abdominal pain, nausea, vomiting or diarrhea. No melena or bloody stool.  GENITOURINARY:  No dysuria, hematuria or incontinence.  SKIN:  No rash or jaundice.  NEUROLOGY:  No syncope, loss of consciousness or seizure.  HEMATOLOGY:  No easy bruising or bleeding.  ENDOCRINOLOGY:  No polyuria, polydipsia, heat or cold intolerance.  NEUROLOGY:  No syncope, loss of consciousness or seizure.   PHYSICAL EXAMINATION:   VITAL SIGNS:  Temperature  98.3, blood pressure 126/67, pulse 116, O2 saturation 87% on oxygen 3 liters.  GENERAL:  The patient is alert, awake, oriented, in no acute distress.  HEENT: Pupils round, equal, reactive to light and accommodation. Moist oral mucosa.  Clear oropharynx. NECK:  Supple. No JVD or carotid bruits. No lymphadenopathy. No thyromegaly.  CARDIOVASCULAR:  S1, S2. Regular rate, rhythm. No murmurs, gallops.  PULMONARY:  Bilateral moderate wheezing. No crackles. No rales. No use of accessory muscles to breathe.  ABDOMEN:  Soft. No distention. No tenderness. No organomegaly. Bowel sounds present.  EXTREMITIES:  No edema, clubbing or cyanosis. No calf tenderness. Bilateral pedal pulses present.  NEUROLOGIC:  A and O x 3. No focal deficits.  Power 5/5. Sensation intact. DTR 2+.   RADIOLOGIC STUDIES:  Chest x-ray is negative for any infiltrate or edema.   LABORATORY DATA:  Urinalysis is negative. INR 0.8. CBC is normal. Glucose 133, BUN 11, creatinine 0.47, sodium 136, potassium 4.4, chloride 101, bicarb 28. Troponin less than 0.02.  IMPRESSION: 1.  Chronic obstructive pulmonary disease exacerbation.  2.  Acute on chronic respiratory failure. Actually, the patient is on home oxygen at 2 liters.  3.  Tachycardia.  4.  Tobacco abuse.   PLAN OF TREATMENT: 1.  The patient is admitted to medical floor. We will continue O2 by nasal cannula. Give Solu-Medrol, Xopenex, Tessalon and Spiriva.  2.  Smoking cessation, was counseled.  3.  GI  and DVT prophylaxis.  4.  I discussed the patient's condition and plan of treatment with the patient.   TIME SPENT: About 52 minutes.    ____________________________ Shaune Pollack, MD qc:dmm D: 09/17/2012 12:27:39 ET T: 09/17/2012 12:50:15 ET JOB#: [id number]  cc: Shaune Pollack, MD, <Dictator> Shaune Pollack MD ELECTRONICALLY SIGNED 09/19/2012 18:11

## 2014-10-21 NOTE — Consult Note (Signed)
Brief Consult Note: Diagnosis: NSTEMI in setting of COPD.   Patient was seen by consultant.   Consult note dictated.   Comments: This could be due to supply demand ischemia. However, primary cardiac event can not be excluded given symptoms and risk factors.  Discussed different management options.  Will proceed with cardiac cath tomorrow once COPD is improved.  Will given Lovenox today.  Electronic Signatures: Lorine BearsArida, Laelyn Blumenthal (MD)  (Signed [phone]-Nov-14 08:35)  Authored: Brief Consult Note   Last Updated: 20-Nov-14 08:35 by Lorine BearsArida, Jezreel Justiniano (MD)

## 2014-10-21 NOTE — Discharge Summary (Signed)
PATIENT NAME:  Ellen Abbott, Ellen Abbott MR#:  [id number] DATE OF BIRTH:  07-28-1950  DATE OF ADMISSION:  05/20/2013 DATE OF DISCHARGE:  05/21/2013  ADMITTING DIAGNOSIS: Chronic obstructive pulmonary disease exacerbation.   DISCHARGE DIAGNOSES:  1.  Acute-on-chronic respiratory failure due to chronic obstructive pulmonary disease exacerbation.  2.  Acute bronchitis.  3.  Non-Q-wave myocardial infarction, likely type 2, due to demand ischemia status post cardiac catheterization, revealing moderate triple-vessel coronary artery disease, on the 21st of November 2014 by Dr. Kirke CorinArida.  4.  Hypertension.  5.  Hyperlipidemia with LDL of 106.  6.  Left ventricular hypertrophy with mildly elevated left ventricular diastolic pressures.  7.  Chronic sinusitis.  8.  Tobacco abuse.   DISCHARGE CONDITION: Stable.   DISCHARGE MEDICATIONS: The patient is to continue: 1.  Pro-Air HFA 2 puffs every 4 to six hours as needed.  2.  Advair Diskus 250/50 one puff twice daily.  3.  DuoNebs 2.5/0.5 mg in 3 mL inhalation solution, 3 mL three times daily.  4.  Prednisone taper 50 p.o. once on the 22nd of November, 2014, then taper by 10 daily until stopped.  5.  Nitroglycerin 0.4 sublingually every 5 minutes as needed.  6.  Atorvastatin 40 p.o. daily.  7.  Aspirin 325 mg p.o. daily.  8.  Metoprolol tartrate 25 mg p.o. twice daily.  9.  Lisinopril 2.5 mg p.o. daily. 10.  Tiotropium 1 capsule inhalation daily.  11.  Guaifenesin 600 mg p.o. twice daily.  12.  Levofloxacin 750 mg p.o. once daily for 4 more days.  13.  Tussionex 5 mL twice daily as needed.   HOME OXYGEN: None.   DIET: Two grams salt, low fat, low cholesterol, regular consistency.   ACTIVITY LIMITATIONS: As tolerated.   FOLLOWUP APPOINTMENT: [facility] days after discharge, Dr. Kirke CorinArida in 1 week after discharge. Also, ENT physician in 1 week after discharge.   HISTORY OF PRESENT ILLNESS: The patient is a 64 year old Caucasian female  with past medical history significant for history of tobacco abuse who presents to the hospital with complaints of shortness of breath. Please refer to Dr. Hilbert OdorSainani's admission note on the 19th of November, 2014.   On arrival to the hospital, she denied any chest pains, admitted of having a 2-day history of significantly worse shortness of breath.   Upon further work-up in the Emergency Room, she was noted to have mildly elevated troponin of 0.38. She was admitted to the hospital for further evaluation. Her EKG revealed normal sinus rhythm with normal axis. No evidence of acute ST-T changes.   Physical exam revealed prolonged inspiratory as well as expiratory phases on lung exam,  minimal and expiratory wheezing, positive use of accessory muscles, but no dullness to percussion. Otherwise, the patient's physical exam was unremarkable.   The patient's lab data done in the Emergency Room showed mild elevation of glucose to 111; otherwise, BMP was unremarkable. The patient's first set of cardiac enzymes were 0.38 as mentioned above. The second set, 0.49, and third set is 0.50. The patient's white blood cell count was normal at 10.0, hemoglobin was 15.8 and platelet count was 321. D-dimer was normal at 0.23.   HOSPITAL COURSE: The patient was admitted to the hospital for further evaluation. Her cardiac enzymes were cycled, and consultation with Dr. Kirke CorinArida, cardiologist, was obtained. Dr. Kirke CorinArida felt that the patient warrants to have cardiac catheterization done in view of her abnormal troponin. The patient underwent cardiac catheterization on  the 21st of November 2014, and cardiac catheterization revealed mild to moderate three-vessel coronary artery disease with no flow-limiting lesions. Global left ventricular function was normal. Ejection fraction estimated was 55%; however, hemodynamic assessment demonstrated borderline systemic hypertension as well as slightly elevated left ventricular end-diastolic  pressures.   Dr. Kirke Corin recommended aggressive medical therapy as well as smoking cessation.   The patient was advised to continue beta blockers as well as aspirin and Lipitor. Her lipid panel was performed and LDL was found to be elevated at 104. The patient's total cholesterol was 174. The patient's triglycerides were 54 and HDL was 61. The patient was initiated on Lipitor. She is to continue those medications and follow up with primary care physician to check her LDL which we attempted to get below 100, preferably below 70.   In regards to hypertension, the patient is to continue metoprolol, and lisinopril was added to her regimen for better blood pressure control.   On the day of discharge, the 21st of November, 2014, the patient's vital signs: Temperature was 97.6, pulse was 79, respiratory rate 20, blood pressure 147/78, saturation was 92% on room air at rest as well as on exertion.   The patient is to follow up with her primary care physician and advance her medications even higher if needed.    In regards to COPD exacerbation, as mentioned above, her acute-on-chronic respiratory failure was felt to be due to COPD exacerbation, acute bronchitis. Chest x-ray did not show any pneumonia. The patient was initiated on Levaquin. She is to continue Levaquin for 4 more days to complete course. She is also to continue steroid taper as well as inhalation therapy. With therapy, she improved and was weaned off oxygen.   In regards to her chronic sinusitis, as mentioned above, the patient is to continue antibiotic therapy for now as she was complaining of some intermittent right parotid gland swelling. I felt that the patient would benefit from ENT evaluation The patient is to follow up with ENT physician in the next 1 week after discharge.   CONDITION ON DISCHARGE:  Stable condition with above-mentioned medications and followup.   TIME SPENT: 40 minutes on this patient.    ____________________________ Katharina Caper, MD rv:np D: 05/21/2013 18:55:46 ET T: 05/21/2013 20:18:02 ET JOB#: [id number]  cc: Katharina Caper, MD, <Dictator> PRIMARY CARE PHYSICIAN Fizza Scales MD ELECTRONICALLY SIGNED 06/08/2013 16:06

## 2014-10-23 NOTE — H&P (Signed)
PATIENT NAME:  Ellen Abbott, Ellen Abbott MR#:  [id number] DATE OF BIRTH:  1951-05-15  DATE OF ADMISSION:  09/04/2011  REFERRING PHYSICIAN: Dr. Bayard Males  PRIMARY CARE PHYSICIAN: [facility]  CHIEF COMPLAINT: Shortness of breath   HISTORY OF PRESENT ILLNESS: Patient is a 64 year old Caucasian female with chronic obstructive pulmonary disease with ongoing tobacco abuse who presents with several days of upper respiratory infection symptoms including rhinorrhea, cough, sore throat. There are no sick contacts or recent antibiotics. No has been no fevers or chills. There is no chest pain. Patient acutely got worse last night with wheezing and took her nebulizers multiple times without improvement. Of note, patient is noncompliant with Advair secondary to cost reasons. Patient says her co-pays are high and she is supposed to be on Advair, however, does not take it. She states she has been "abusing her Albuterol" using it every two hours on a chronic basis to treat her chronic obstructive pulmonary disease as well as her nebulizers. On arrival here she was found to be having oxygen sats 70% and dyspneic and received steroids and multiple nebulizers. She is somewhat better, however, still feels short of breath. Hospitalist service was contacted for further evaluation and management.   PAST MEDICAL HISTORY:  1. Chronic obstructive pulmonary disease.  2. Ongoing tobacco abuse.  3. Osteoporosis.  4. Gastroesophageal reflux disease.   PAST SURGICAL HISTORY: Hysterectomy.   ALLERGIES: No known drug allergies.   MEDICATIONS:  1. ProAir as needed.  2. Advair, however, she is noncompliant with it.  3. Ipratropium/albuterol nebulizers as needed. 4. Fosamax, which patient is noncompliant with.   SOCIAL HISTORY: She lives by herself, smokes 3 to 4 cigarettes per day. No drugs. Has occasional alcohol.   FAMILY HISTORY: Both parents have chronic obstructive pulmonary disease.    REVIEW OF SYSTEMS: CONSTITUTIONAL: No fever,  fatigue, weakness, or weight changes. EYES: No blurry vision, double vision. ENT: No tinnitus. Has rhinorrhea and some sore throat. RESPIRATORY: Nonproductive cough, wheezing last night. No hemoptysis. Chronic dyspnea on exertion. Has history of chronic obstructive pulmonary disease. CARDIOVASCULAR: No chest pain, orthopnea, or edema. GASTROINTESTINAL: No nausea, vomiting, diarrhea, abdominal pain. Has occasional heartburn. GENITOURINARY: No dysuria, hematuria, or frequency. ENDOCRINE: No polyuria, nocturia or thyroid problems. HEME/LYMPH: No anemia or bleeding or easy bruising. SKIN: No rashes. MUSCULOSKELETAL: Denies arthritis. NEUROLOGIC: Denies numbness, weakness, dementia, ataxia. PSYCH: No anxiety or insomnia.   PHYSICAL EXAMINATION:  VITAL SIGNS: Temperature on arrival 98.1, pulse 145, respiratory rate 26, blood pressure 150/76, 91% on room air on the initial vitals, however, per ER physician was having oxygen sats in the 70s, currently is on 3 liters, sating 92%.    GENERAL: Patient is awake, alert, appointed x3, talking in full sentences.   HEENT: Normocephalic, atraumatic. Pupils are equal and reactive. Anicteric sclerae. Moist mucous membranes.   NECK: Supple. No JVD. No lymphadenopathy.   CARDIOVASCULAR: S1, S2, regular rate and rhythm. No murmurs, rubs, or gallops.   LUNGS: Decreased breath sounds all fields. No crackles. Poor airflow. Minimal wheezing.   ABDOMEN: Soft, nontender, nondistended. No hepatomegaly noted.    EXTREMITIES: No significant lower extremity edema.   NEUROLOGICAL: Cranial nerves II through XII grossly intact. Strength 5/5 all extremities.   LABORATORY, DIAGNOSTIC, AND RADIOLOGICAL DATA: Glucose 129, creatinine 0.64, potassium 3.9, CO2 30. LFTs: AST 14, otherwise within normal limits. CK-MB 1.4, troponin less than 0.02. WBC 10.1, hemoglobin 15.7, hematocrit 47, platelets 328. INR 0.9. X-ray per ER physician does not show any acute infiltrate, however, it  has not  been read. EKG shows sinus tachycardia, Q waves in V1 to 3. No acute ST elevations or depressions.   ASSESSMENT AND PLAN: We have a 64 year old with chronic obstructive pulmonary disease, noncompliant with medications, ongoing tobacco abuse who presents with acute respiratory failure hypoxic in nature secondary to chronic obstructive pulmonary disease exacerbation. At this point she is feeling somewhat better, however, still shortness of breath and decreased breath sounds and hypoxia. Would admit the patient to the hospital with around-the-clock nebulizers as well as p.r.n. nebs including steroids as well as supplemental oxygen and azithromycin. Would send sputum cultures and start the patient on Spiriva as well. Tobacco abuse was strongly discouraged in this patient. Adherence to long acting inhalers was also strongly recommended. She has no fever, significant leukocytosis or evidence for pneumonia. Also start the patient on some cough medicine and monitor her respirations and vitals. She has elevated glucose and I would also check a hemoglobin A1c. I would start her on deep vein thrombosis prophylaxis with Lovenox and PPI.   CODE STATUS: FULL CODE.   TOTAL TIME SPENT: 50 minutes.   ____________________________ Krystal EatonShayiq Kambry Takacs, MD sa:cms D: 09/04/2011 08:10:38 ET T: 09/04/2011 08:21:27 ET JOB#: [id number]  cc: Krystal EatonShayiq Akeira Lahm, MD, <Dictator> [facility] MD ELECTRONICALLY SIGNED 09/05/2011 15:38

## 2014-10-23 NOTE — Discharge Summary (Signed)
PATIENT NAME:  Ellen Abbott, Milee MR#:  [id number] DATE OF BIRTH:  1951/02/14  DATE OF ADMISSION:  09/04/2011 DATE OF DISCHARGE:  09/05/2011  PRIMARY CARE PHYSICIAN: None.  PRESENTING COMPLAINT: Shortness of breath and hypoxemia.   DISCHARGE DIAGNOSES:  1. Acute on chronic respiratory failure secondary to chronic obstructive pulmonary disease exacerbation.  2. Acute mild bronchitis.  3. Tobacco abuse.   CONDITION ON DISCHARGE: Fair. Vitals stable. Saturations are 91% to 93% on room air and exertion.   DISCHARGE MEDICATIONS:  1. Prednisone taper.  2. Advair 250/50 one puff twice a day. 3. Spiriva 1 capsule inhalation daily.  4. Zithromax 250 mg p.o. daily.  5. ProAir HFA 90 mcg inhalation daily.  6. Ipratropium bromide and albuterol SVNs four times daily as needed.   DIET: Regular.   DISCHARGE INSTRUCTIONS: The patient is advised smoking cessation. The patient is advised to get [facility] [place] [facility] [place] [facility] [place].   LABS/STUDIES: CBC is within normal limits, except white count of 13.7 and glucose is 191. The rest of the chemistry is normal.   Urinalysis is negative for urinary tract infection.   Chest x-ray: hyperinflation and mild interstitial prominence suggestive of mild fibrosis.   EKG showed sinus tach.   Cardiac enzymes were negative.   BRIEF SUMMARY OF HOSPITAL COURSE: Ellen Abbott is a 64 year old Caucasian female who is a chronic smoker who comes to the Emergency Room with:  1. Acute on chronic respiratory failure secondary to chronic obstructive pulmonary disease flare. Her saturations were in the upper 70s, likely from COPD flare and ongoing tobacco abuse. The patient was started on high-dose IV steroids and nebulizer treatments were continued. She also received azithromycin and p.r.n. Robitussin. The patient was started on Spiriva and was strongly encouraged compliance with long acting inhalers and tobacco abstinence. She ambulated in the room with  saturations 90 to 93% on room air.  2. Tobacco abuse. The patient is not agreeable to quit smoking.  3. Mild acute bronchitis. She will finish a course of Zithromax.   Her hospital stay otherwise remained stable. The patient remained a FULL CODE.   TIME SPENT: 40 minutes.  ____________________________ Wylie HailSona A. Allena KatzPatel, MD sap:slb D: 09/05/2011 15:00:11 ET T: 09/05/2011 17:10:30 ET JOB#: [id number]  cc: Armondo Cech A. Allena KatzPatel, MD, <Dictator> Willow OraSONA A Azreal Stthomas MD ELECTRONICALLY SIGNED 09/12/2011 16:04

## 2018-10-15 ENCOUNTER — Ambulatory Visit: Payer: Medicare Other | Admitting: Physician Assistant

## 2024-05-01 DEATH — deceased
